# Patient Record
Sex: Female | Born: 1945 | Hispanic: No | Marital: Married | Smoking: Never smoker
Health system: Southern US, Community
[De-identification: ages and names within clinical notes are randomized; demographics above are authoritative.]

## PROBLEM LIST (undated history)

## (undated) DIAGNOSIS — E039 Hypothyroidism, unspecified: Secondary | ICD-10-CM

## (undated) DIAGNOSIS — M4800 Spinal stenosis, site unspecified: Secondary | ICD-10-CM

## (undated) DIAGNOSIS — E079 Disorder of thyroid, unspecified: Secondary | ICD-10-CM

## (undated) DIAGNOSIS — I10 Essential (primary) hypertension: Secondary | ICD-10-CM

## (undated) HISTORY — DX: Disorder of thyroid, unspecified: E07.9

## (undated) HISTORY — DX: Spinal stenosis, site unspecified: M48.00

---

## 2009-06-08 DEATH — deceased

## 2017-04-17 ENCOUNTER — Encounter: Payer: Self-pay | Admitting: Cardiovascular Disease

## 2019-09-21 ENCOUNTER — Other Ambulatory Visit: Payer: Self-pay | Admitting: Neurology

## 2019-09-21 ENCOUNTER — Encounter: Payer: Self-pay | Admitting: Cardiovascular Disease

## 2019-09-21 ENCOUNTER — Other Ambulatory Visit: Payer: Self-pay

## 2019-09-21 ENCOUNTER — Ambulatory Visit (INDEPENDENT_AMBULATORY_CARE_PROVIDER_SITE_OTHER): Payer: Medicare Other | Admitting: Cardiovascular Disease

## 2019-09-21 VITALS — BP 138/84 | HR 57 | Ht 63.0 in | Wt 120.5 lb

## 2019-09-21 DIAGNOSIS — M5412 Radiculopathy, cervical region: Secondary | ICD-10-CM

## 2019-09-21 DIAGNOSIS — I209 Angina pectoris, unspecified: Secondary | ICD-10-CM

## 2019-09-21 DIAGNOSIS — I10 Essential (primary) hypertension: Secondary | ICD-10-CM

## 2019-09-21 DIAGNOSIS — M79602 Pain in left arm: Secondary | ICD-10-CM

## 2019-09-21 DIAGNOSIS — R079 Chest pain, unspecified: Secondary | ICD-10-CM | POA: Diagnosis not present

## 2019-09-21 DIAGNOSIS — M5481 Occipital neuralgia: Secondary | ICD-10-CM

## 2019-09-21 MED ORDER — LOSARTAN POTASSIUM 50 MG PO TABS: 75.0000 mg | ORAL_TABLET | Freq: Every day | ORAL | 6 refills | Status: DC

## 2019-09-21 NOTE — Progress Notes (Signed)
Cardiology Office Note  Date:  09/21/2019   ID:  Katrina Donovan, DOB 1946/01/05, MRN [id number]  PCP:  Katrina Merles, MD   Chief Complaint  Patient presents with  . other    C/o Flunctuating BP and chest pain radiates left arm pain with neck pain and headaches. Meds reviewed verbally with pt.    HPI:  [name] is a 73 year old woman with history of hypertension Shortness of breath per outside records Who presents by self-referral for chest pain left arm pain  Recently seen by primary care physician, reported having some dizziness, neck pain, chest pain left arm left shoulder pain Reports blood pressure elevated [id number] systolic  She was referred to [facility] Underwent cardiac enzymes, additional lab work Cardiac enzymes negative, low BNP  EKG normal rhythm no significant ST-T wave changes Discharged with follow-up today  She has continued to have symptoms radiating into her left arm shoulder down in the biceps area Reports having family history of cardiac disease  Also having difficulty controlling her blood pressure Did not check it over the past year, started to check it recently Reports typically [id number] systolic or higher, sometimes [id number] systolic  Since she was told blood pressure was elevated she has not been eating very much, weight is down 10 pounds  EKG personally reviewed by myself on todays visit Shows sinus bradycardia rate 57 bpm no significant ST-T wave changes  Outside records reviewed, Myoview April 17, 2017 no ischemia Performed in [place]   PMH:   has a past medical history of Spinal stenosis and Thyroid disease.  PSH:   History reviewed. No pertinent surgical history.  Current Outpatient Medications  Medication Sig Dispense Refill  . aspirin EC 81 MG tablet Take 81 mg by mouth daily.    Marland Kitchen levothyroxine (SYNTHROID) 50 MCG tablet Take 50 mcg by mouth daily.    Marland Kitchen losartan (COZAAR) 50 MG tablet Take 1.5 tablets (75 mg total) by mouth daily. 45  tablet 6   No current facility-administered medications for this visit.     Allergies:   Patient has no allergy information on record.   Social History:  The patient  reports that she has never smoked. She has never used smokeless tobacco. She reports that she does not drink alcohol or use drugs.   Family History:   family history includes Cancer in her mother.    Review of Systems: Review of Systems  Constitutional: Negative.   HENT: Negative.   Respiratory: Negative.   Cardiovascular: Positive for chest pain.       Radiating to left arm, left neck  Gastrointestinal: Negative.   Musculoskeletal: Negative.   Neurological: Negative.   Psychiatric/Behavioral: Negative.   All other systems reviewed and are negative.   PHYSICAL EXAM: VS:  BP 138/84   Pulse (!) 57   Ht 5\' 3"  (1.6 m)   Wt 120 lb 8 oz (54.7 kg)   SpO2 99%   BMI 21.35 kg/m  , BMI Body mass index is 21.35 kg/m. GEN: Well nourished, well developed, in no acute distress HEENT: normal Neck: no JVD, carotid bruits, or masses Cardiac: RRR; no murmurs, rubs, or gallops,no edema  Respiratory:  clear to auscultation bilaterally, normal work of breathing GI: soft, nontender, nondistended, + BS MS: no deformity or atrophy Skin: warm and dry, no rash Neuro:  Strength and sensation are intact Psych: euthymic mood, full affect   Recent Labs: No results found for requested labs within last 8760 hours.  Lipid Panel No results found for: CHOL, HDL, LDLCALC, TRIG    Wt Readings from Last 3 Encounters:  09/21/19 120 lb 8 oz (54.7 kg)       ASSESSMENT AND PLAN:  Problem List Items Addressed This Visit    None    Visit Diagnoses    Chest pain of uncertain etiology    -  Primary   Relevant Orders   EKG 12-Lead   Pain of left upper extremity       Angina pectoris (HCC)       Relevant Medications   aspirin EC 81 MG tablet   losartan (COZAAR) 50 MG tablet     Etiology of her symptoms is unclear She is  non-smoker, no diabetes, cholesterol reasonable She is very concerned about underlying ischemia and requesting a stress test We have ordered pharmacologic Myoview for this week  Left shoulder pain Suspect component of musculoskeletal etiology Denies trauma, heavy lifting She is already taking Tylenol Suggested she continue icing, NSAIDs as needed Move blood pressure cuff over to right arm  Hypertension Reports blood pressure continues to run high at home Recommend she increase losartan from 50 up to 75 mg daily She has been very strict with her diet recently lost 10 pounds, recommend she could liberalize her diet Commend she call us in the next week or 2 with blood pressure measurements  Disposition:   F/U as needed   Total encounter time more than 45 minutes  Greater than 50% was spent in counseling and coordination of care with the patient    Signed, Esmond Plants, M.D., Ph.D. [place], [place]

## 2019-09-21 NOTE — Patient Instructions (Addendum)
We will order a stress test Thursday , lexiscan myoview For chest pain, CAD   Medication Instructions:  Please increase the losartan up to 75 mg daily  If you need a refill on your cardiac medications before your next appointment, please call your pharmacy.    Lab work: No new labs needed   If you have labs (blood work) drawn today and your tests are completely normal, you will receive your results only by: Marland Kitchen MyChart Message (if you have MyChart) OR . A paper copy in the mail If you have any lab test that is abnormal or we need to change your treatment, we will call you to review the results.   Testing/Procedures: [place]  Your caregiver has ordered a Stress Test with nuclear imaging. The purpose of this test is to evaluate the blood supply to your heart muscle. This procedure is referred to as a "Non-Invasive Stress Test." This is because other than having an IV started in your vein, nothing is inserted or "invades" your body. Cardiac stress tests are done to find areas of poor blood flow to the heart by determining the extent of coronary artery disease (CAD). Some patients exercise on a treadmill, which naturally increases the blood flow to your heart, while others who are  unable to walk on a treadmill due to physical limitations have a pharmacologic/chemical stress agent called Lexiscan . This medicine will mimic walking on a treadmill by temporarily increasing your coronary blood flow.   Please note: these test may take anywhere between 2-4 hours to complete  PLEASE REPORT TO [place] AT THE FIRST DESK WILL DIRECT YOU WHERE TO GO  Date of Procedure:_____________________________________  Arrival Time for Procedure:______________________________    PLEASE NOTIFY THE OFFICE AT LEAST 24 HOURS IN ADVANCE IF YOU ARE UNABLE TO KEEP YOUR APPOINTMENT.  [phone] AND  PLEASE NOTIFY NUCLEAR MEDICINE AT [facility] AT LEAST 24 HOURS IN ADVANCE IF YOU  ARE UNABLE TO KEEP YOUR APPOINTMENT. [phone]  How to prepare for your Myoview test:  1. Do not eat or drink after midnight 2. No caffeine for 24 hours prior to test 3. No smoking 24 hours prior to test. 4. Your medication may be taken with water.  If your doctor stopped a medication because of this test, do not take that medication. 5. Ladies, please do not wear dresses.  Skirts or pants are appropriate. Please wear a short sleeve shirt. 6. No perfume, cologne or lotion. 7. Wear comfortable walking shoes. No heels!    Follow-Up: At [facility], you and your health needs are our priority.  As part of our continuing mission to provide you with exceptional heart care, we have created designated Provider Care Teams.  These Care Teams include your primary Cardiologist (physician) and Advanced Practice Providers (APPs -  Physician Assistants and Nurse Practitioners) who all work together to provide you with the care you need, when you need it.  . You will need a follow up appointment as needed   . Providers on your designated Care Team:   . Murray Hodgkins, NP . Christell Faith, PA-C . Marrianne Mood, PA-C  Any Other Special Instructions Will Be Listed Below (If Applicable).  For educational health videos Log in to : [email or web address] Or : [email or web address], password : [email or web address]

## 2019-09-22 ENCOUNTER — Ambulatory Visit
Admission: RE | Admit: 2019-09-22 | Discharge: 2019-09-22 | Disposition: A | Discharge status: Home / Self Care | Payer: Medicare Other | Source: Ambulatory Visit | Attending: Neurology | Admitting: Neurology

## 2019-09-22 ENCOUNTER — Telehealth: Payer: Self-pay | Admitting: Cardiovascular Disease

## 2019-09-22 DIAGNOSIS — M5412 Radiculopathy, cervical region: Secondary | ICD-10-CM

## 2019-09-22 DIAGNOSIS — M5481 Occipital neuralgia: Secondary | ICD-10-CM | POA: Diagnosis present

## 2019-09-22 DIAGNOSIS — R079 Chest pain, unspecified: Secondary | ICD-10-CM

## 2019-09-22 NOTE — Telephone Encounter (Signed)
Order entered and ready for scheduling. Please make sure to schedule this Thursday 09/24/2019. I will then send time of testing to Dr. Mortimer Fries.

## 2019-09-22 NOTE — Telephone Encounter (Signed)
Please let scheduling know when nuc orders are in .

## 2019-09-24 ENCOUNTER — Other Ambulatory Visit: Payer: Self-pay

## 2019-09-24 ENCOUNTER — Ambulatory Visit
Admission: RE | Admit: 2019-09-24 | Discharge: 2019-09-24 | Disposition: A | Discharge status: Home / Self Care | Payer: Medicare Other | Source: Ambulatory Visit | Attending: Cardiovascular Disease | Admitting: Cardiovascular Disease

## 2019-09-24 DIAGNOSIS — R079 Chest pain, unspecified: Secondary | ICD-10-CM | POA: Diagnosis not present

## 2019-09-24 MED ORDER — TECHNETIUM TC 99M TETROFOSMIN IV KIT
10.7500 | PACK | Freq: Once | INTRAVENOUS | Status: AC | PRN
  Administered 2019-09-24: 10.75 via INTRAVENOUS

## 2019-09-24 MED ORDER — REGADENOSON 0.4 MG/5ML IV SOLN
0.4000 mg | Freq: Once | INTRAVENOUS | Status: AC
  Administered 2019-09-24: 0.4 mg via INTRAVENOUS
  Filled 2019-09-24: qty 5

## 2019-09-24 MED ORDER — TECHNETIUM TC 99M TETROFOSMIN IV KIT
29.6400 | PACK | Freq: Once | INTRAVENOUS | Status: AC | PRN
  Administered 2019-09-24: 29.64 via INTRAVENOUS

## 2019-09-25 LAB — NM MYOCAR MULTI W/SPECT W/WALL MOTION / EF
LV dias vol: 44 mL (ref 46–106)
LV sys vol: 11 mL
Peak HR: 99 {beats}/min
Percent HR: 67 %
Rest HR: 57 {beats}/min
SDS: 1
SRS: 6
SSS: 0
TID: 1.11

## 2019-10-17 ENCOUNTER — Ambulatory Visit: Payer: Medicare Other | Attending: Internal Medicine

## 2019-10-17 DIAGNOSIS — Z23 Encounter for immunization: Secondary | ICD-10-CM

## 2019-10-17 NOTE — Progress Notes (Signed)
   Covid-19 Vaccination Clinic  Name:  Katrina Donovan    MRN: [id number] DOB: [birth year]  10/17/2019  [name] was observed post Covid-19 immunization for 15 minutes without incidence. She was provided with Vaccine Information Sheet and instruction to access the V-Safe system.   [name] was instructed to call 911 with any severe reactions post vaccine: Marland Kitchen Difficulty breathing  . Swelling of your face and throat  . A fast heartbeat  . A bad rash all over your body  . Dizziness and weakness    Immunizations Administered    Name Date Dose VIS Date Route   Pfizer COVID-19 Vaccine 10/17/2019  2:55 PM 0.3 mL 09/18/2019 Intramuscular   Manufacturer: [place]   Lot: [id number]   [place]: [id number]

## 2019-11-04 ENCOUNTER — Ambulatory Visit: Payer: Medicare Other

## 2019-11-07 ENCOUNTER — Ambulatory Visit: Payer: Medicare Other | Attending: Internal Medicine

## 2019-11-07 DIAGNOSIS — Z23 Encounter for immunization: Secondary | ICD-10-CM

## 2019-11-07 NOTE — Progress Notes (Signed)
   Covid-19 Vaccination Clinic  Name:  Katrina Donovan    MRN: [id number] DOB: [birth year]  11/07/2019  [name] was observed post Covid-19 immunization for 15 minutes without incidence. She was provided with Vaccine Information Sheet and instruction to access the V-Safe system.   [name] was instructed to call 911 with any severe reactions post vaccine: Marland Kitchen Difficulty breathing  . Swelling of your face and throat  . A fast heartbeat  . A bad rash all over your body  . Dizziness and weakness    Immunizations Administered    Name Date Dose VIS Date Route   Pfizer COVID-19 Vaccine 11/07/2019 12:25 PM 0.3 mL 09/18/2019 Intramuscular   Manufacturer: [place]   Lot: [id number]   [place]: [id number]

## 2020-07-18 ENCOUNTER — Telehealth (INDEPENDENT_AMBULATORY_CARE_PROVIDER_SITE_OTHER): Payer: Medicare Other | Admitting: Gastroenterology

## 2020-07-18 DIAGNOSIS — R634 Abnormal weight loss: Secondary | ICD-10-CM

## 2020-07-18 DIAGNOSIS — R197 Diarrhea, unspecified: Secondary | ICD-10-CM

## 2020-07-18 NOTE — Progress Notes (Signed)
Katrina Donovan  [address]  [place]  [place], [place] [address]  Main: [phone]  Fax: [phone]   Gastroenterology Consultation  Referring Provider:     Charlsie Merles* Primary Care Physician:  Charlsie Merles, MD Reason for Consultation:     Diarrhea         HPI:   Virtual Visit via video  Note  I connected with patient on 07/18/20 at  9:15 AM EDT by video  and verified that I am speaking with the correct person using two identifiers.   I discussed the limitations, risks, security and privacy concerns of performing an evaluation and management service by video and the availability of in person appointments. I also discussed with the patient that there may be a patient responsible charge related to this service. The patient expressed understanding and agreed to proceed.  Location of the patient: Home Location of provider: Home Participating persons: Patient and provider only   History of Present Illness: Diarrhea  Katrina Donovan is a 74 y.o. y/o female referred for consultation & management  by Dr. Posey Pronto, Daymon Larsen, MD.  She states that for the past 6 weeks she has been being diarrhea multiple bowel movements per day that are nonbloody and she has lost about 5 pounds of weight.  Lots of gas and bloating.  Denies any use of any artificial sweeteners, soda, new medications.  Last colonoscopy was 3 to 5 years back and she recollects was normal.  No clear abdominal pain.  She is a bit concerned about the onset of these new symptoms.  She reported she had recent stool tests which were negative for any infection.  Past Medical History:  Diagnosis Date  . Spinal stenosis   . Thyroid disease     No past surgical history on file.  Prior to Admission medications   Medication Sig Start Date End Date Taking? Authorizing Provider  aspirin EC 81 MG tablet Take 81 mg by mouth daily.    [provider]  levothyroxine (SYNTHROID) 50 MCG tablet  Take 50 mcg by mouth daily. 09/01/19   [provider]  losartan (COZAAR) 50 MG tablet Take 1.5 tablets (75 mg total) by mouth daily. 09/21/19   Minna Merritts, MD    Family History  Problem Relation Age of Onset  . Cancer Mother      Social History   Tobacco Use  . Smoking status: Never Smoker  . Smokeless tobacco: Never Used  Substance Use Topics  . Alcohol use: Never  . Drug use: Never    Allergies as of 07/18/2020  . (Not on File)    Review of Systems:    All systems reviewed and negative except where noted in HPI. General Appearance:    Alert, cooperative, no distress, appears stated age  Head:    Normocephalic, without obvious abnormality, atraumatic  Eyes:    PERRL, conjunctiva/corneas clear,  Ears:    Grossly normal hearing    Neurologic:   Grossly appears normal     Observations/Objective:  Labs: CBC No results found for: WBC, RBC, HGB, HCT, PLT, MCV, MCH, MCHC, RDW, LYMPHSABS, MONOABS, EOSABS, BASOSABS CMP  No results found for: NA, K, CL, CO2, GLUCOSE, BUN, CREATININE, CALCIUM, PROT, ALBUMIN, AST, ALT, ALKPHOS, BILITOT, GFRNONAA, GFRAA  Imaging Studies: No results found.  Assessment and Plan:   [name] is a 74 y.o. y/o female has been referred for diarrhea ongoing for over 6 weeks.  She states that  her stool tests were negative.  Weight loss 5 pounds.    Plan :   1. Check TSH, celiac serology, fecal calprotectin 2. Obtain recent results of stool tests 3. Schedule for EGD and colonoscopy.  If negative will consider imaging down the road may end up treating with Xifaxan if all tests are negative.  Follow-up in 6 weeks  I have discussed alternative options, risks & benefits,  which include, but are not limited to, bleeding, infection, perforation,respiratory complication & drug reaction.  The patient agrees with this plan & written consent will be obtained.       I discussed the assessment and treatment plan with the patient.  The patient was provided an opportunity to ask questions and all were answered. The patient agreed with the plan and demonstrated an understanding of the instructions.   The patient was advised to call back or seek an in-person evaluation if the symptoms worsen or if the condition fails to improve as anticipated.  I provided 15 minutes of face-to-face time during this encounter.   Dr Katrina Bellows MD,MRCP [facility]) Gastroenterology/Hepatology Pager: [phone]   Speech recognition software was used to dictate the above note.

## 2020-07-19 ENCOUNTER — Other Ambulatory Visit: Payer: Self-pay

## 2020-07-19 ENCOUNTER — Telehealth: Payer: Self-pay

## 2020-07-19 DIAGNOSIS — R197 Diarrhea, unspecified: Secondary | ICD-10-CM

## 2020-07-19 DIAGNOSIS — R634 Abnormal weight loss: Secondary | ICD-10-CM

## 2020-07-19 MED ORDER — NA SULFATE-K SULFATE-MG SULF 17.5-3.13-1.6 GM/177ML PO SOLN: 1.0000 | Freq: Once | ORAL | 0 refills | Status: AC

## 2020-07-19 NOTE — Telephone Encounter (Signed)
Called pt to schedule endoscopy procedures and also give information regarding her pending labs.  Unable to contact, lvm to return call

## 2020-07-22 ENCOUNTER — Other Ambulatory Visit
Admission: RE | Admit: 2020-07-22 | Discharge: 2020-07-22 | Disposition: A | Discharge status: Home / Self Care | Payer: Medicare Other | Source: Ambulatory Visit | Attending: Gastroenterology | Admitting: Gastroenterology

## 2020-07-22 ENCOUNTER — Other Ambulatory Visit: Payer: Self-pay

## 2020-07-22 DIAGNOSIS — Z20822 Contact with and (suspected) exposure to covid-19: Secondary | ICD-10-CM | POA: Insufficient documentation

## 2020-07-22 DIAGNOSIS — Z01812 Encounter for preprocedural laboratory examination: Secondary | ICD-10-CM | POA: Insufficient documentation

## 2020-07-22 LAB — SARS CORONAVIRUS 2 (TAT 6-24 HRS): SARS Coronavirus 2: NEGATIVE

## 2020-07-25 ENCOUNTER — Encounter: Payer: Self-pay | Admitting: Internal Medicine

## 2020-07-25 LAB — MISC LABCORP TEST (SEND OUT): Labcorp test code: 165118

## 2020-07-26 ENCOUNTER — Ambulatory Visit
Admission: RE | Admit: 2020-07-26 | Discharge: 2020-07-26 | Disposition: A | Discharge status: Home / Self Care | Payer: Medicare Other | Attending: Gastroenterology | Admitting: Gastroenterology

## 2020-07-26 ENCOUNTER — Ambulatory Visit: Payer: Medicare Other | Admitting: Certified Registered"

## 2020-07-26 ENCOUNTER — Encounter
Admission: RE | Disposition: A | Discharge status: Home / Self Care | Payer: Self-pay | Source: Home / Self Care | Attending: Gastroenterology

## 2020-07-26 ENCOUNTER — Encounter: Payer: Self-pay | Admitting: Gastroenterology

## 2020-07-26 DIAGNOSIS — Z79890 Hormone replacement therapy (postmenopausal): Secondary | ICD-10-CM | POA: Diagnosis not present

## 2020-07-26 DIAGNOSIS — I10 Essential (primary) hypertension: Secondary | ICD-10-CM | POA: Diagnosis not present

## 2020-07-26 DIAGNOSIS — D122 Benign neoplasm of ascending colon: Secondary | ICD-10-CM | POA: Insufficient documentation

## 2020-07-26 DIAGNOSIS — Z79899 Other long term (current) drug therapy: Secondary | ICD-10-CM | POA: Diagnosis not present

## 2020-07-26 DIAGNOSIS — R634 Abnormal weight loss: Secondary | ICD-10-CM | POA: Insufficient documentation

## 2020-07-26 DIAGNOSIS — Z7982 Long term (current) use of aspirin: Secondary | ICD-10-CM | POA: Diagnosis not present

## 2020-07-26 DIAGNOSIS — K2950 Unspecified chronic gastritis without bleeding: Secondary | ICD-10-CM | POA: Diagnosis not present

## 2020-07-26 DIAGNOSIS — E039 Hypothyroidism, unspecified: Secondary | ICD-10-CM | POA: Insufficient documentation

## 2020-07-26 DIAGNOSIS — R197 Diarrhea, unspecified: Secondary | ICD-10-CM | POA: Diagnosis not present

## 2020-07-26 HISTORY — PX: ESOPHAGOGASTRODUODENOSCOPY (EGD) WITH PROPOFOL: SHX5813

## 2020-07-26 HISTORY — DX: Essential (primary) hypertension: I10

## 2020-07-26 HISTORY — DX: Hypothyroidism, unspecified: E03.9

## 2020-07-26 HISTORY — PX: COLONOSCOPY WITH PROPOFOL: SHX5780

## 2020-07-26 SURGERY — COLONOSCOPY WITH PROPOFOL
Anesthesia: General

## 2020-07-26 MED ORDER — PHENYLEPHRINE HCL (PRESSORS) 10 MG/ML IV SOLN
INTRAVENOUS | Status: DC | PRN
  Administered 2020-07-26: 100 ug via INTRAVENOUS

## 2020-07-26 MED ORDER — PROPOFOL 10 MG/ML IV BOLUS
INTRAVENOUS | Status: DC | PRN
  Administered 2020-07-26: 50 mg via INTRAVENOUS

## 2020-07-26 MED ORDER — GLYCOPYRROLATE 0.2 MG/ML IJ SOLN
INTRAMUSCULAR | Status: DC | PRN
  Administered 2020-07-26: .2 mg via INTRAVENOUS

## 2020-07-26 MED ORDER — LIDOCAINE HCL (CARDIAC) PF 100 MG/5ML IV SOSY
PREFILLED_SYRINGE | INTRAVENOUS | Status: DC | PRN
  Administered 2020-07-26: 100 mg via INTRAVENOUS

## 2020-07-26 MED ORDER — EPHEDRINE SULFATE 50 MG/ML IJ SOLN
INTRAMUSCULAR | Status: DC | PRN
  Administered 2020-07-26 (×2): 5 mg via INTRAVENOUS

## 2020-07-26 MED ORDER — SODIUM CHLORIDE 0.9 % IV SOLN: INTRAVENOUS | Status: DC

## 2020-07-26 MED ORDER — PROPOFOL 500 MG/50ML IV EMUL
INTRAVENOUS | Status: DC | PRN
  Administered 2020-07-26: 145 ug/kg/min via INTRAVENOUS

## 2020-07-26 NOTE — H&P (Addendum)
[name], MD [address] [place], [place], [place], [address] [id number], [place], [place], [place], [address] Phone: [phone]  Fax: [phone]  Primary Care Physician:  Charlsie Merles, MD   Pre-Procedure History & Physical: HPI:  Katrina Donovan is a 74 y.o. female is here for an endoscopy and colonoscopy    Past Medical History:  Diagnosis Date  . Hypertension   . Hypothyroidism   . Spinal stenosis   . Thyroid disease     History reviewed. No pertinent surgical history.  Prior to Admission medications   Medication Sig Start Date End Date Taking? Authorizing Provider  levothyroxine (SYNTHROID) 50 MCG tablet Take 50 mcg by mouth daily. 09/01/19  Yes [provider]  losartan (COZAAR) 50 MG tablet Take 1.5 tablets (75 mg total) by mouth daily. 09/21/19  Yes Minna Merritts, MD  aspirin EC 81 MG tablet Take 81 mg by mouth daily.    [provider]    Allergies as of 07/19/2020  . (Not on File)    Family History  Problem Relation Age of Onset  . Cancer Mother     Social History   Socioeconomic History  . Marital status: Married    Spouse name: Not on file  . Number of children: Not on file  . Years of education: Not on file  . Highest education level: Not on file  Occupational History  . Not on file  Tobacco Use  . Smoking status: Never Smoker  . Smokeless tobacco: Never Used  Vaping Use  . Vaping Use: Never used  Substance and Sexual Activity  . Alcohol use: Never  . Drug use: Never  . Sexual activity: Not on file  Other Topics Concern  . Not on file  Social History Narrative  . Not on file   Social Determinants of Health   Financial Resource Strain:   . Difficulty of Paying Living Expenses: Not on file  Food Insecurity:   . Worried About Charity fundraiser in the Last Year: Not on file  . Ran Out of Food in the Last Year: Not on file  Transportation Needs:   . Lack of Transportation (Medical): Not  on file  . Lack of Transportation (Non-Medical): Not on file  Physical Activity:   . Days of Exercise per Week: Not on file  . Minutes of Exercise per Session: Not on file  Stress:   . Feeling of Stress : Not on file  Social Connections:   . Frequency of Communication with Friends and Family: Not on file  . Frequency of Social Gatherings with Friends and Family: Not on file  . Attends Religious Services: Not on file  . Active Member of Clubs or Organizations: Not on file  . Attends Archivist Meetings: Not on file  . Marital Status: Not on file  Intimate Partner Violence:   . Fear of Current or Ex-Partner: Not on file  . Emotionally Abused: Not on file  . Physically Abused: Not on file  . Sexually Abused: Not on file    Review of Systems: See HPI, otherwise negative ROS  Physical Exam: There were no vitals taken for this visit. General:   Alert,  pleasant and cooperative in NAD Head:  Normocephalic and atraumatic. Neck:  Supple; no masses or thyromegaly. Lungs:  Clear throughout to auscultation, normal respiratory effort.    Heart:  +S1, +S2, Regular rate and rhythm, No edema. Abdomen:  Soft, nontender and  nondistended. Normal bowel sounds, without guarding, and without rebound.   Neurologic:  Alert and  oriented x4;  grossly normal neurologically.  Impression/Plan: Katrina Donovan is here for an endoscopy and colonoscopy  to be performed for  evaluation of unintentional weight loss and diarrhea    Risks, benefits, limitations, and alternatives regarding endoscopy have been reviewed with the patient.  Questions have been answered.  All parties agreeable.   [name], MD  07/26/2020, 9:28 AM

## 2020-07-26 NOTE — Anesthesia Procedure Notes (Signed)
Procedure Name: General with mask airway Performed by: Kelton Pillar, CRNA Pre-anesthesia Checklist: Patient identified, Suction available, Emergency Drugs available and Patient being monitored Patient Re-evaluated:Patient Re-evaluated prior to induction Oxygen Delivery Method: Simple face mask Induction Type: IV induction Placement Confirmation: positive ETCO2 and CO2 detector Dental Injury: Teeth and Oropharynx as per pre-operative assessment

## 2020-07-26 NOTE — Op Note (Signed)
[facility]enter[facility] Patient Name: Katrina Donovan Procedure Date: 07/26/2020 9:35 AM MRN: [id number] Account #: [id number] Date of Birth: 1946/06/09 Admit Type: Outpatient Age: 74 Room: [facility] ENDO ROOM 1 Gender: Female Note Status: Finalized Procedure:             Colonoscopy Indications:           Clinically significant diarrhea of unexplained origin Providers:             [name] MD, MD Medicines:             Monitored Anesthesia Care Complications:         No immediate complications. Procedure:             Pre-Anesthesia Assessment:                        - Prior to the procedure, a History and Physical was                         performed, and patient medications, allergies and                         sensitivities were reviewed. The patient's tolerance                         of previous anesthesia was reviewed.                        - The risks and benefits of the procedure and the                         sedation options and risks were discussed with the                         patient. All questions were answered and informed                         consent was obtained.                        - ASA Grade Assessment: II - A patient with mild                         systemic disease.                        After obtaining informed consent, the colonoscope was                         passed under direct vision. Throughout the procedure,                         the patient's blood pressure, pulse, and oxygen                         saturations were monitored continuously. The                         Colonoscope was introduced through the anus and  advanced to the the terminal ileum. The colonoscopy                         was performed with ease. The patient tolerated the                         procedure well. The quality of the bowel preparation                         was excellent. Findings:      The perianal and  digital rectal examinations were normal.      A 5 mm polyp was found in the ascending colon. The polyp was sessile.       The polyp was removed with a cold snare. Resection and retrieval were       complete.      The colon (entire examined portion) appeared normal. Biopsies for       histology were taken with a cold forceps from the entire colon for       evaluation of microscopic colitis.      The exam was otherwise without abnormality on direct and retroflexion       views. Impression:            - One 5 mm polyp in the ascending colon, removed with                         a cold snare. Resected and retrieved.                        - The entire examined colon is normal. Biopsied.                        - The examination was otherwise normal on direct and                         retroflexion views. Recommendation:        - Discharge patient to home (with escort).                        - Resume previous diet.                        - Continue present medications.                        - Await pathology results.                        - Return to my office in 3 weeks. Procedure Code(s):     --- Professional ---                        [phone], Colonoscopy, flexible; with removal of                         tumor(s), polyp(s), or other lesion(s) by snare                         technique  90240, 46, Colonoscopy, flexible; with biopsy, single                         or multiple Diagnosis Code(s):     --- Professional ---                        K63.5, Polyp of colon                        R19.7, Diarrhea, unspecified CPT copyright 2019 American Medical Association. All rights reserved. The codes documented in this report are preliminary and upon coder review may  be revised to meet current compliance requirements. [name], MD [name] MD, MD 07/26/2020 10:35:34 AM This report has been signed electronically. Number of Addenda: 0 Note Initiated On: 07/26/2020  9:35 AM Scope Withdrawal Time: 0 hours 15 minutes 43 seconds  Total Procedure Duration: 0 hours 20 minutes 36 seconds  Estimated Blood Loss:  Estimated blood loss: none.      [facility]

## 2020-07-26 NOTE — Anesthesia Preprocedure Evaluation (Signed)
Anesthesia Evaluation  Patient identified by MRN, date of birth, ID band Patient awake    Reviewed: Allergy & Precautions, H&P , NPO status , Patient's Chart, lab work & pertinent test results  History of Anesthesia Complications Negative for: history of anesthetic complications  Airway Mallampati: II  TM Distance: >3 FB     Dental  (+) Teeth Intact   Pulmonary neg pulmonary ROS, neg sleep apnea, neg COPD,    breath sounds clear to auscultation       Cardiovascular hypertension, (-) angina(-) Past MI and (-) Cardiac Stents (-) dysrhythmias  Rhythm:regular Rate:Normal     Neuro/Psych negative neurological ROS  negative psych ROS   GI/Hepatic negative GI ROS, Neg liver ROS,   Endo/Other  Hypothyroidism   Renal/GU negative Renal ROS  negative genitourinary   Musculoskeletal   Abdominal   Peds  Hematology negative hematology ROS (+)   Anesthesia Other Findings Past Medical History: No date: Hypertension No date: Hypothyroidism No date: Spinal stenosis No date: Thyroid disease  History reviewed. No pertinent surgical history.     Reproductive/Obstetrics negative OB ROS                             Anesthesia Physical Anesthesia Plan  ASA: II  Anesthesia Plan: General   Post-op Pain Management:    Induction:   PONV Risk Score and Plan: Propofol infusion and TIVA  Airway Management Planned: Nasal Cannula  Additional Equipment:   Intra-op Plan:   Post-operative Plan:   Informed Consent: I have reviewed the patients History and Physical, chart, labs and discussed the procedure including the risks, benefits and alternatives for the proposed anesthesia with the patient or authorized representative who has indicated his/her understanding and acceptance.     Dental Advisory Given  Plan Discussed with: Anesthesiologist, CRNA and Surgeon  Anesthesia Plan Comments:          Anesthesia Quick Evaluation

## 2020-07-26 NOTE — Transfer of Care (Signed)
Immediate Anesthesia Transfer of Care Note  Patient: Katrina Donovan  Procedure(s) Performed: COLONOSCOPY WITH PROPOFOL (N/A ) ESOPHAGOGASTRODUODENOSCOPY (EGD) WITH PROPOFOL (N/A )  Patient Location: Endoscopy Unit  Anesthesia Type:General  Level of Consciousness: drowsy and responds to stimulation  Airway & Oxygen Therapy: Patient Spontanous Breathing and Patient connected to face mask oxygen  Post-op Assessment: Report given to RN and Post -op Vital signs reviewed and stable  Post vital signs: Reviewed and stable  Last Vitals:  Vitals Value Taken Time  BP 111/61 07/26/20 1034  Temp 36.5 C 07/26/20 1034  Pulse 82 07/26/20 1035  Resp 19 07/26/20 1035  SpO2 100 % 07/26/20 1035  Vitals shown include unvalidated device data.  Last Pain: There were no vitals filed for this visit.       Complications: No complications documented.

## 2020-07-26 NOTE — Op Note (Addendum)
[facility] Gastroenterology Patient Name: Katrina Donovan Procedure Date: 07/26/2020 9:35 AM MRN: [id number] Account #: [id number] Date of Birth: Aug 15, 1946 Admit Type: Outpatient Age: 74 Room: [facility] ENDO ROOM 1 Gender: Female Note Status: Finalized Procedure:             Upper GI endoscopy Indications:           Weight loss Providers:             [name] MD, MD Medicines:             Monitored Anesthesia Care Complications:         No immediate complications. Procedure:             Pre-Anesthesia Assessment:                        - Prior to the procedure, a History and Physical was                         performed, and patient medications, allergies and                         sensitivities were reviewed. The patient's tolerance                         of previous anesthesia was reviewed.                        - The risks and benefits of the procedure and the                         sedation options and risks were discussed with the                         patient. All questions were answered and informed                         consent was obtained.                        - ASA Grade Assessment: II - A patient with mild                         systemic disease.                        After obtaining informed consent, the endoscope was                         passed under direct vision. Throughout the procedure,                         the patient's blood pressure, pulse, and oxygen                         saturations were monitored continuously. The Endoscope                         was introduced through the mouth, and advanced to the  third part of duodenum. The upper GI endoscopy was                         accomplished with ease. The patient tolerated the                         procedure well. Findings:      The esophagus was normal.      The examined duodenum was normal.      Localized mild inflammation characterized by  congestion (edema) and       erythema was found on the lesser curvature of the gastric antrum.       Biopsies were taken with a cold forceps for histology.      The cardia and gastric fundus were normal on retroflexion. Impression:            - Normal esophagus.                        - Normal examined duodenum.                        - Gastritis. Biopsied. Recommendation:        - Await pathology results.                        - Perform a colonoscopy today. Procedure Code(s):     --- Professional ---                        [phone], Esophagogastroduodenoscopy, flexible,                         transoral; with biopsy, single or multiple CPT copyright 2019 American Medical Association. All rights reserved. The codes documented in this report are preliminary and upon coder review may  be revised to meet current compliance requirements. [name], MD [name] MD, MD 07/26/2020 10:08:19 AM This report has been signed electronically. Number of Addenda: 0 Note Initiated On: 07/26/2020 9:35 AM Estimated Blood Loss:  Estimated blood loss: none.      [facility]

## 2020-07-27 ENCOUNTER — Encounter: Payer: Self-pay | Admitting: Gastroenterology

## 2020-07-27 LAB — SURGICAL PATHOLOGY

## 2020-07-27 NOTE — Progress Notes (Signed)
Inform patient she has gastritis commence on Prilosec 40 mg once a day.  1 polyp was a tubular adenoma.  Rest of the biopsies were normal.  Office visit in 2 to 3 weeks to discuss treatments for irritable bowel syndrome.  Avoid NSAIDs.  No repeat colonoscopy in view of age.

## 2020-07-27 NOTE — Anesthesia Postprocedure Evaluation (Signed)
Anesthesia Post Note  Patient: Katrina Donovan  Procedure(s) Performed: COLONOSCOPY WITH PROPOFOL (N/A ) ESOPHAGOGASTRODUODENOSCOPY (EGD) WITH PROPOFOL (N/A )  Patient location during evaluation: PACU Anesthesia Type: General Level of consciousness: awake and alert Pain management: pain level controlled Vital Signs Assessment: post-procedure vital signs reviewed and stable Respiratory status: spontaneous breathing, nonlabored ventilation and respiratory function stable Cardiovascular status: blood pressure returned to baseline and stable Postop Assessment: no apparent nausea or vomiting Anesthetic complications: no   No complications documented.   Last Vitals:  Vitals:   07/26/20 1044 07/26/20 1054  BP: 120/77 135/75  Pulse: 75 63  Resp: (!) 24 (!) 21  Temp:    SpO2: 100% 100%    Last Pain:  Vitals:   07/26/20 1054  TempSrc:   PainSc: 0-No pain                 Brett Canales Dhaval Woo

## 2020-08-15 ENCOUNTER — Telehealth: Payer: Self-pay

## 2020-08-15 NOTE — Telephone Encounter (Signed)
Called pt to inform of results and Dr. Anna's recommendations.  Unable to contact, LVM to return call 

## 2020-08-15 NOTE — Telephone Encounter (Signed)
-----   Message from [name], MD sent at 07/27/2020  9:58 AM EDT ----- Inform patient she has gastritis commence on Prilosec 40 mg once a day.  1 polyp was a tubular adenoma.  Rest of the biopsies were normal.  Office visit in 2 to 3 weeks to discuss treatments for irritable bowel syndrome.  Avoid NSAIDs.  No repeat col onoscopy in view of age.

## 2020-08-15 NOTE — Telephone Encounter (Signed)
Pt has been notified of results and Dr. Georgeann Oppenheim recommendations. Pt's daughter will call back to schedule pt's follow up and inform us of what pharmacy she prefers.

## 2020-09-19 IMAGING — MR MR HEAD W/O CM
12 series · 48 of 48 positions shown · non-contrast
Comparison: None.

CLINICAL DATA: Dizziness and neck pain

EXAM:
MRI HEAD WITHOUT CONTRAST
TECHNIQUE: Multiplanar, multiecho pulse sequences of the brain and surrounding
structures were obtained without intravenous contrast.

[Series 5: ax dwi_tracew · axial · 3.0mm · 0.60mm/px · z∈[-135,+20]mm · 3 of 48 slices shown]
[im 1/48]
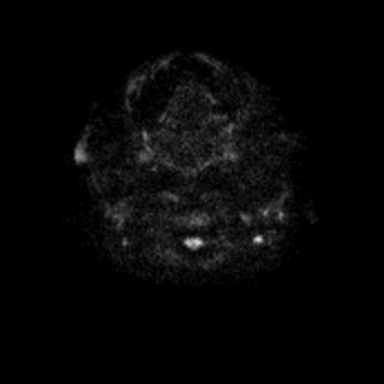
[im 24/48]
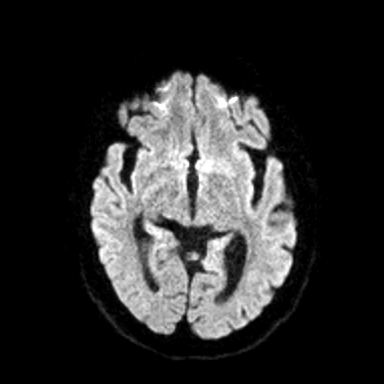
[im 48/48]
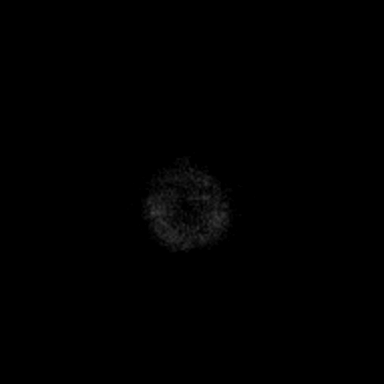

[Series 6: ax dwi_adc · axial · 3.0mm · 0.60mm/px · z∈[-135,+20]mm · 4 of 48 slices shown]
[im 1/48]
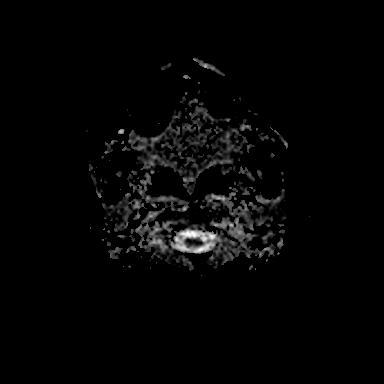
[im 16/48]
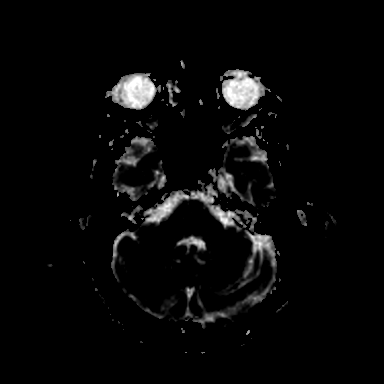
[im 32/48]
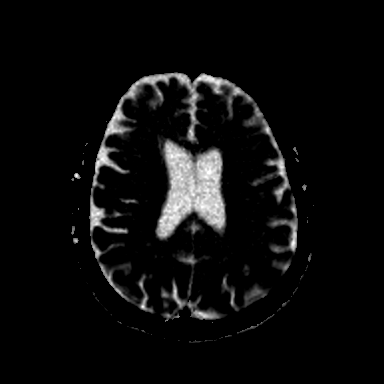
[im 48/48]
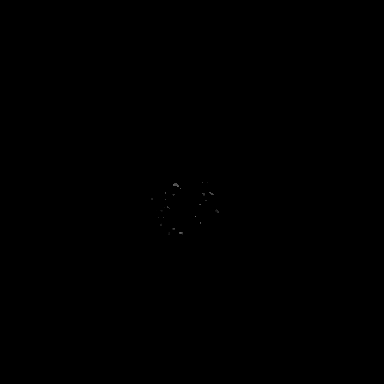

[Series 7: cor dwi_tracew · coronal · 5.0mm · 0.60mm/px · 3 of 38 slices shown]
[im 1/38]
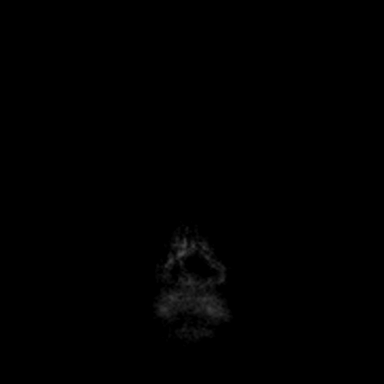
[im 19/38]
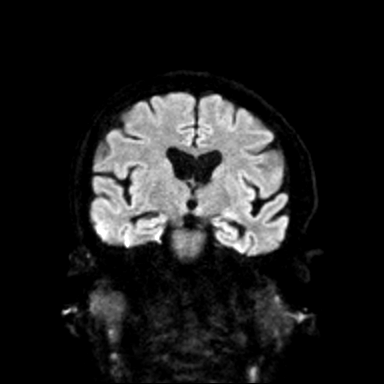
[im 38/38]
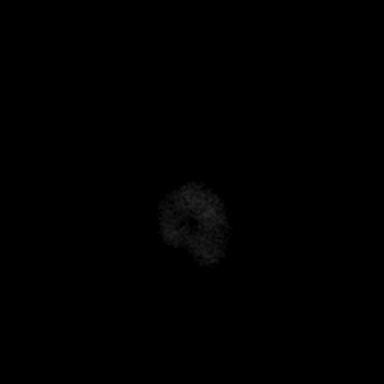

[Series 8: cor dwi_adc · coronal · 5.0mm · 0.60mm/px · 3 of 36 slices shown]
[im 1/36]
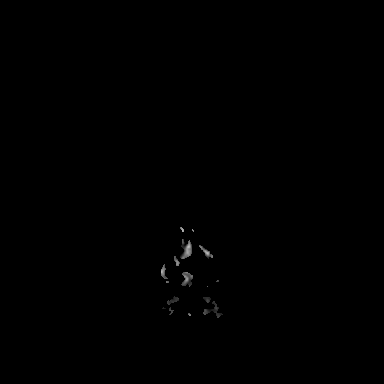
[im 18/36]
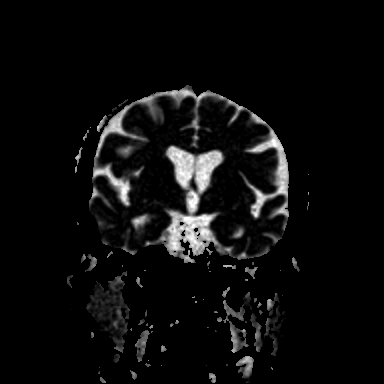
[im 36/36]
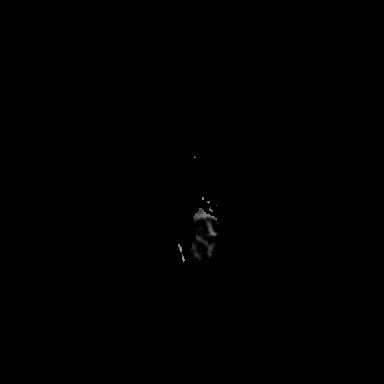

[Series 9: T1 · sagittal · 5.0mm · 0.62mm/px · 2 of 24 slices shown (1 of 2)]
[im 1/24]
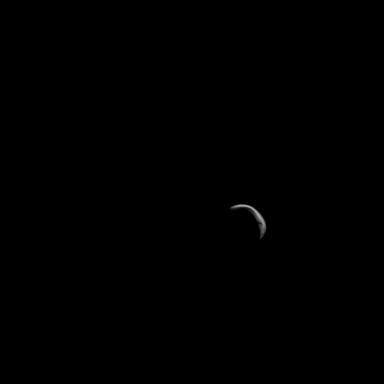
[im 24/24]
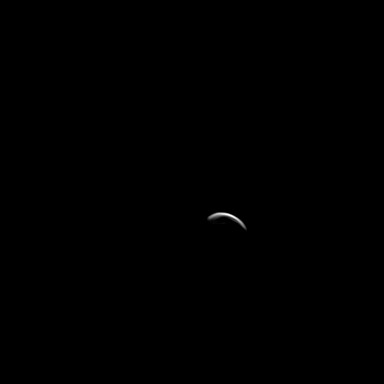

[Series 10: T2 · axial · 5.0mm · 0.53mm/px · z∈[-126,+17]mm · 2 of 25 slices shown]
[im 1/25]
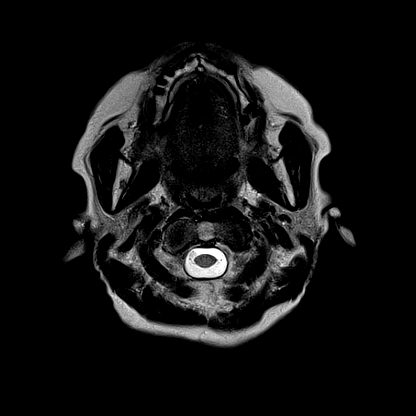
[im 25/25]
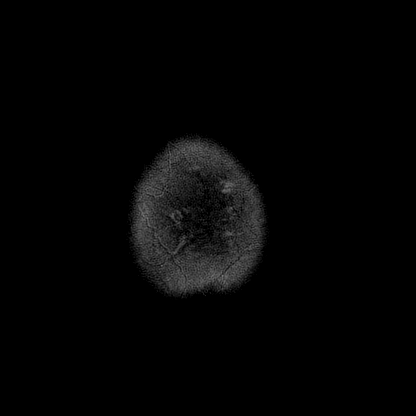

[Series 11: mag_images · axial · 3.0mm · 0.90mm/px · z∈[-143,+33]mm · 4 of 60 slices shown]
[im 1/60]
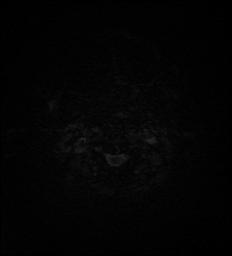
[im 20/60]
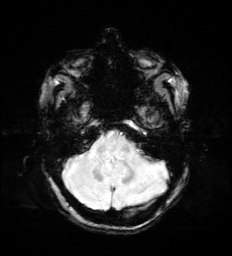
[im 40/60]
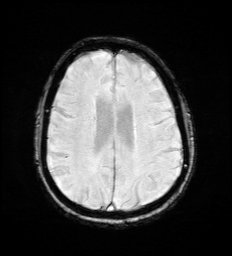
[im 60/60]
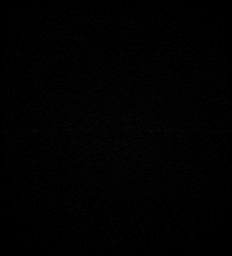

[Series 12: pha_images · axial · 3.0mm · 0.90mm/px · z∈[-143,+30]mm · 4 of 59 slices shown]
[im 1/59]
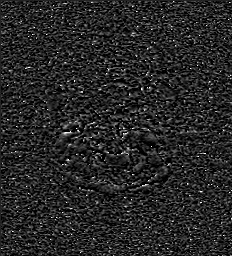
[im 20/59]
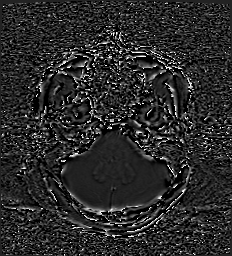
[im 39/59]
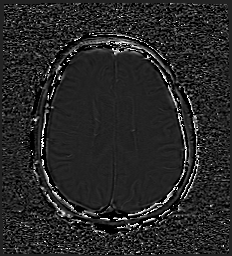
[im 59/59]
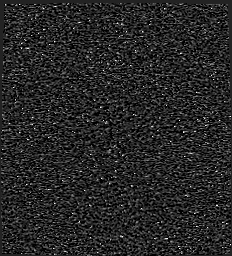

[Series 13: swi_images · axial · 3.0mm · 0.90mm/px · z∈[-143,+33]mm · 4 of 60 slices shown]
[im 1/60]
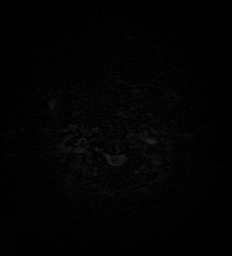
[im 20/60]
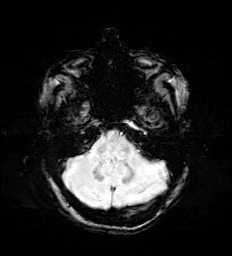
[im 40/60]
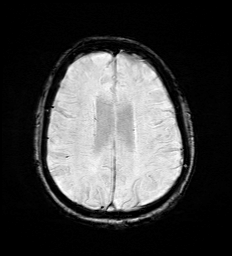
[im 60/60]
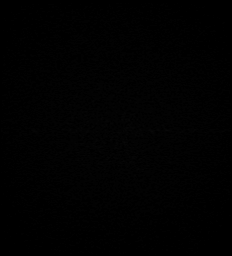

[Series 15: FLAIR · axial · 3.0mm · 0.53mm/px · z∈[-135,+26]mm · 4 of 55 slices shown]
[im 1/55]
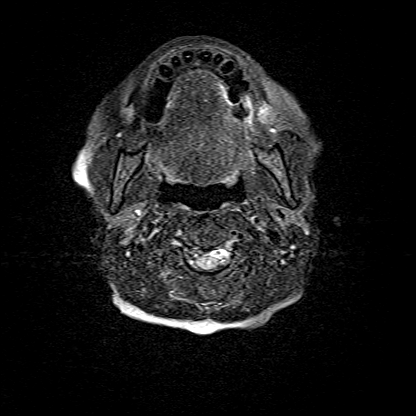
[im 19/55]
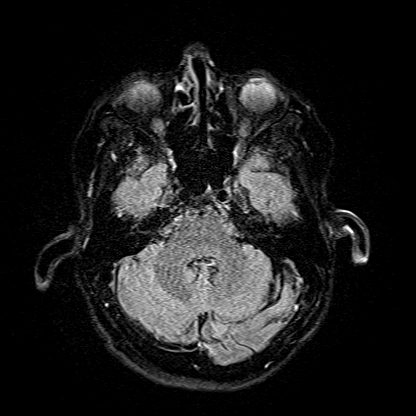
[im 37/55]
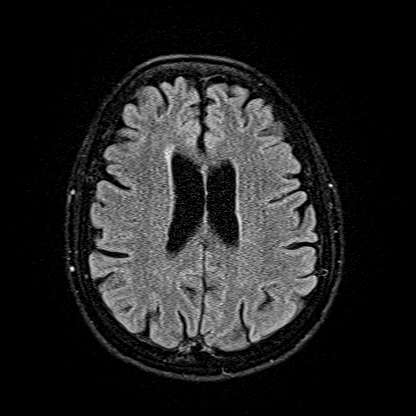
[im 55/55]
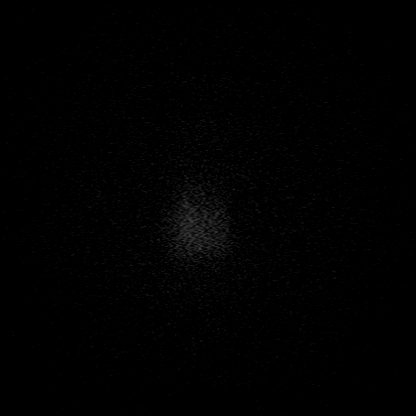

[Series 16: T1 · axial · 1.0mm · 0.98mm/px · z∈[-143,+31]mm · 13 of 176 slices shown (2 of 2)]
[im 1/176]
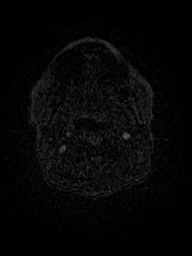
[im 15/176]
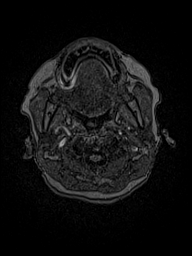
[im 30/176]
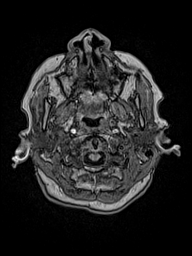
[im 44/176]
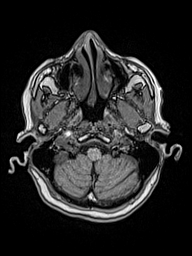
[im 59/176]
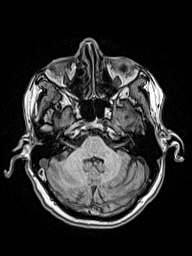
[im 73/176]
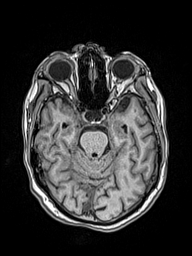
[im 88/176]
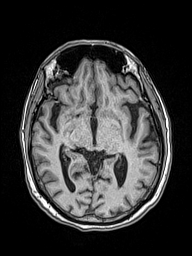
[im 103/176]
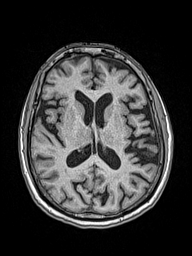
[im 117/176]
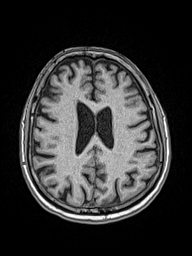
[im 132/176]
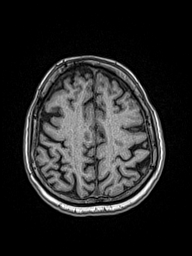
[im 146/176]
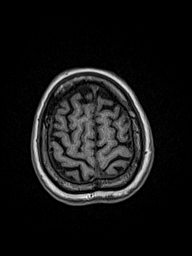
[im 161/176]
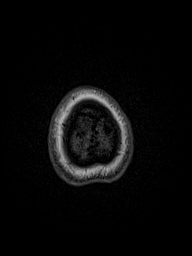
[im 176/176]
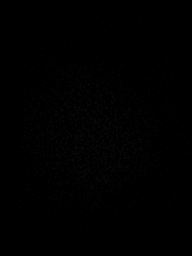

[Series 17: T2 post-contrast · coronal · 5.0mm · 0.57mm/px · 2 of 29 slices shown]
[im 1/29]
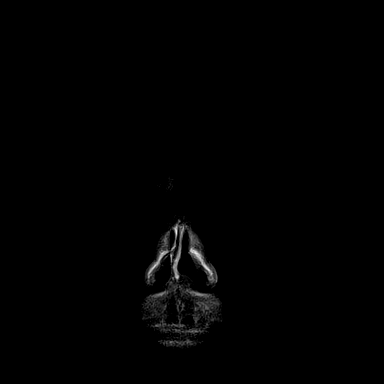
[im 29/29]
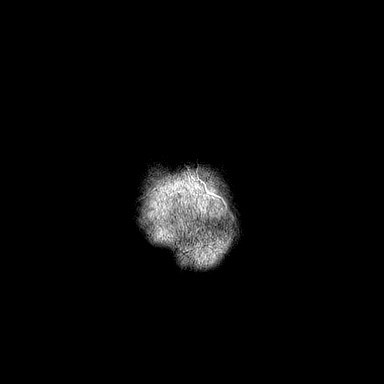

[48 of 48 positions shown; findings below may reference images not displayed]

FINDINGS: Brain: There is no acute infarction or intracranial hemorrhage.
There is no intracranial mass, mass effect, or edema. There is no
hydrocephalus or extra-axial fluid collection. Few small foci of T2
hyperintensity in the supratentorial white matter are nonspecific
but may reflect minor chronic microvascular ischemic changes.

Vascular: Major vessel flow voids at the skull base are preserved.

Skull and upper cervical spine: Normal marrow signal is preserved.

Sinuses/Orbits: Minor paranasal sinus mucosal thickening. Right lens
replacement.

Other: Incidental note is made of a partially empty sella. Mastoid
air cells are clear.
IMPRESSION: No intracranial mass, evidence of recent infarction, or hemorrhage.
Minor chronic microvascular ischemic changes.

## 2021-06-28 ENCOUNTER — Ambulatory Visit (INDEPENDENT_AMBULATORY_CARE_PROVIDER_SITE_OTHER): Payer: Medicare Other | Admitting: Internal Medicine

## 2021-06-28 ENCOUNTER — Encounter: Payer: Self-pay | Admitting: Internal Medicine

## 2021-06-28 ENCOUNTER — Other Ambulatory Visit: Payer: Self-pay

## 2021-06-28 DIAGNOSIS — G4719 Other hypersomnia: Secondary | ICD-10-CM | POA: Diagnosis not present

## 2021-06-28 NOTE — Progress Notes (Signed)
Name: Katrina Donovan MRN: [id number] DOB: 1945-11-07     I connected with the patient by telephone enabled telemedicine visit and verified that I am speaking with the correct person using two identifiers.    I discussed the limitations, risks, security and privacy concerns of performing an evaluation and management service by telemedicine and the availability of in-person appointments. I also discussed with the patient that there may be a patient responsible charge related to this service. The patient expressed understanding and agreed to proceed.  PATIENT AGREES AND CONFIRMS -YES   Other persons participating in the visit and their role in the encounter: Patient, nursing  This visit type was conducted due to national recommendations for restrictions regarding the COVID-19 Pandemic (e.g. social distancing).  This format is felt to be most appropriate for this patient at this time.  All issues noted in this document were discussed and addressed.     PATIENT AT HOME PHYSICIAN AT OFFICE  HISTORY OF PRESENT ILLNESS:   Patient is seen today for problems and issues with sleep related to excessive daytime sleepiness Patient  has been having sleep problems for many years Patient has been having excessive daytime sleepiness for a long time Patient has been having extreme fatigue and tiredness, lack of energy +  very Loud snoring every night    Discussed sleep data and reviewed with patient.  Encouraged proper weight management.  Discussed driving precautions and its relationship with hypersomnolence.  Discussed operating dangerous equipment and its relationship with hypersomnolence.  Discussed sleep hygiene, and benefits of a fixed sleep waked time.  The importance of getting eight or more hours of sleep discussed with patient.  Discussed limiting the use of the computer and television before bedtime.  Decrease naps during the day, so night time sleep will become enhanced.  Limit  caffeine, and sleep deprivation.  HTN, stroke, and heart failure are potential risk factors.    EPWORTH SLEEP SCORE 10    PAST MEDICAL HISTORY :   has a past medical history of Hypertension, Hypothyroidism, Spinal stenosis, and Thyroid disease.  has a past surgical history that includes Colonoscopy with propofol (N/A, 07/26/2020) and Esophagogastroduodenoscopy (egd) with propofol (N/A, 07/26/2020). Prior to Admission medications   Medication Sig Start Date End Date Taking? Authorizing Provider  aspirin EC 81 MG tablet Take 81 mg by mouth daily.    [provider]  levothyroxine (SYNTHROID) 50 MCG tablet Take 50 mcg by mouth daily. 09/01/19   [provider]  losartan (COZAAR) 50 MG tablet Take 1.5 tablets (75 mg total) by mouth daily. 09/21/19   Minna Merritts, MD   Not on File  FAMILY HISTORY:  family history includes Cancer in her mother. SOCIAL HISTORY:  reports that she has never smoked. She has never used smokeless tobacco. She reports that she does not drink alcohol and does not use drugs.   Review of Systems:  Gen:  Denies  fever, sweats, chills weight loss excessive daytime sleepiness HEENT: Denies blurred vision, double vision, ear pain, eye pain, hearing loss, nose bleeds, sore throat Cardiac:  No dizziness, chest pain or heaviness, chest tightness,edema, No JVD Resp:   No cough, -sputum production, -shortness of breath,-wheezing, -hemoptysis,  Gi: Denies swallowing difficulty, stomach pain, nausea or vomiting, diarrhea, constipation, bowel incontinence Gu:  Denies bladder incontinence, burning urine Ext:   Denies Joint pain, stiffness or swelling Skin: Denies  skin rash, easy bruising or bleeding or hives Endoc:  Denies polyuria, polydipsia , polyphagia or  weight change Psych:   Denies depression, +insomnia or hallucinations  Other:  All other systems negative   ALL OTHER ROS ARE NEGATIVE       ASSESSMENT AND PLAN SYNOPSIS  75 yo  [place] female with excessive daytime sleepiness, patient was seen by eye doctor and 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 All questions answered   Total Time Spent  20 mins   Maretta Bees Patricia Pesa, M.D.  [facility]  Medical Director [place] Director [facility] Cardio-Pulmonary Department

## 2021-07-27 NOTE — Addendum Note (Signed)
Addended by: Claudette Head A on: 07/27/2021 11:54 AM   Modules accepted: Orders

## 2021-10-06 ENCOUNTER — Other Ambulatory Visit: Payer: Self-pay

## 2021-10-06 MED ORDER — PROLIA 60 MG/ML ~~LOC~~ SOSY: PREFILLED_SYRINGE | SUBCUTANEOUS | 0 refills | Status: AC

## 2022-06-24 NOTE — Progress Notes (Unsigned)
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 Medications reviewed by the patient verbally.     HPI:  Katrina Donovan is a 76 year old woman with history of  hypertension Shortness of breath  Who presents by self-referral for cardiac risk factor discussion, recent diagnosis of central vein occlusion  She presents today with her daughter, Dr. 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, concern for hypertension Losartan dosing was increased up to 50 twice daily She reports blood pressure well controlled at home typically 382 up to 505 systolic Luckily no significant visual deficits reported [place]  In general feeling well, some mild shortness of breath when climbing stairs still active at baseline Able to walk on flat surface without any difficulty  Previously with episodes of chest pain Stress test 12/20, no significant ischemia  Lab work reviewed Total cholesterol 171 triglycerides 79 HDL 62 LDL 94 A1c 5.7, normal renal function creatinine 0.6  EKG personally reviewed by myself on todays visit Sinus bradycardia rate 58 bpm no significant ST-T wave changes, PAC noted  Other past medical history reviewed Myoview April 17, 2017 no ischemia Performed in [place]   PMH:   has a past medical history of Hypertension, Hypothyroidism, Spinal stenosis, and Thyroid disease.  PSH:    Past Surgical History:  Procedure Laterality Date   COLONOSCOPY WITH PROPOFOL N/A 07/26/2020   Procedure: COLONOSCOPY WITH PROPOFOL;  Surgeon: [name], MD;  Location: [facility] ENDOSCOPY;  Service: Gastroenterology;  Laterality: N/A;   ESOPHAGOGASTRODUODENOSCOPY (EGD) WITH PROPOFOL N/A  07/26/2020   Procedure: ESOPHAGOGASTRODUODENOSCOPY (EGD) WITH PROPOFOL;  Surgeon: [name], MD;  Location: [facility] ENDOSCOPY;  Service: Gastroenterology;  Laterality: N/A;    Current Outpatient Medications  Medication Sig Dispense Refill   aspirin EC 81 MG tablet Take 81 mg by mouth daily.     denosumab (PROLIA) 60 MG/ML SOSY injection Inject '60mg'$  once every 6 months; Diagnosis Osteoporosis (M81.0) 1 mL 0   levothyroxine (SYNTHROID) 50 MCG tablet Take 50 mcg by mouth daily.     losartan (COZAAR) 50 MG tablet Take 50 mg by mouth 2 (two) times daily.     Multiple Vitamin (MULTI-VITAMIN) tablet Take 1 tablet by mouth daily.     No current facility-administered medications for this visit.    Allergies:   Patient has no allergy information on record.   Social History:  The patient  reports that she has never smoked. She has never used smokeless tobacco. She reports that she does not drink alcohol and does not use drugs.   Family History:   family history includes Cancer in her mother.    Review of Systems: Review of Systems  Constitutional: Negative.   HENT: Negative.    Respiratory:  Positive for shortness of breath.   Cardiovascular: Negative.   Gastrointestinal: Negative.   Musculoskeletal: Negative.   Neurological: Negative.   Psychiatric/Behavioral: Negative.    All other systems reviewed and are negative.   PHYSICAL EXAM: VS:  BP 130/64 (BP Location: Left Arm, Patient Position: Sitting, Cuff Size: Normal)   Pulse (!) 58   Ht 5' 2.5" (1.588 m)   Wt 122 lb 4 oz (55.5 kg)   SpO2 99%  BMI 22.00 kg/m  , BMI Body mass index is 22 kg/m. Constitutional:  oriented to person, place, and time. No distress.  HENT:  Head: Grossly normal Eyes:  no discharge. No scleral icterus.  Neck: No JVD, no carotid bruits  Cardiovascular: Regular rate and rhythm, no murmurs appreciated Pulmonary/Chest: Clear to auscultation bilaterally, no wheezes or rails Abdominal: Soft.  no distension.   no tenderness.  Musculoskeletal: Normal range of motion Neurological:  normal muscle tone. Coordination normal. No atrophy Skin: Skin warm and dry Psychiatric: normal affect, pleasant   Recent Labs: No results found for requested labs within last 365 days.    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, unspecified complication status       Relevant Medications   losartan (COZAAR) 50 MG tablet     Central vein occlusion of retina Felt to be nonischemic Low risk factor profile : non-smoker, no diabetes, cholesterol reasonable  Hyperlipidemia Discussed screening options, we have ordered CT coronary calcium scoring for further risk stratification  Hypertension Losartan up to 50 twice daily in the past several days Blood pressure running well at home no changes made   Total encounter time more than 30 minutes  Greater than 50% was spent in counseling and coordination of care with the patient    Signed, Esmond Plants, M.D., Ph.D. [place], [place]

## 2022-06-25 ENCOUNTER — Ambulatory Visit: Payer: Medicare Other | Attending: Cardiovascular Disease | Admitting: Cardiovascular Disease

## 2022-06-25 ENCOUNTER — Encounter: Payer: Self-pay | Admitting: Cardiovascular Disease

## 2022-06-25 VITALS — BP 130/64 | HR 58 | Ht 62.5 in | Wt 122.2 lb

## 2022-06-25 DIAGNOSIS — H348112 Central retinal vein occlusion, right eye, stable: Secondary | ICD-10-CM

## 2022-06-25 DIAGNOSIS — E782 Mixed hyperlipidemia: Secondary | ICD-10-CM

## 2022-06-25 DIAGNOSIS — I10 Essential (primary) hypertension: Secondary | ICD-10-CM | POA: Diagnosis not present

## 2022-06-25 NOTE — Patient Instructions (Addendum)
Medication Instructions:  No changes  If you need a refill on your cardiac medications before your next appointment, please call your pharmacy.    Lab work: No new labs needed   Testing/Procedures:  CT Coronary Calcium Score:  Your physician has recommended that you have CT Coronary Calcium Score.  - $99 out of pocket cost at the time of your test - Call [phone] to schedule at your convenience.  Location: [place], [place] [address]    Follow-Up: At [facility], you and your health needs are our priority.  As part of our continuing mission to provide you with exceptional heart care, we have created designated Provider Care Teams.  These Care Teams include your primary Cardiologist (physician) and Advanced Practice Providers (APPs -  Physician Assistants and Nurse Practitioners) who all work together to provide you with the care you need, when you need it.  You will need a follow up appointment as needed  Providers on your designated Care Team:   Murray Hodgkins, NP Christell Faith, PA-C Cadence Kathlen Mody, [place]  COVID-19 Vaccine Information can be found at: [email or web address].uk For questions related to vaccine distribution or appointments, please email [email or web address] or call [phone].

## 2022-06-26 ENCOUNTER — Telehealth: Payer: Self-pay | Admitting: Cardiovascular Disease

## 2022-06-26 ENCOUNTER — Ambulatory Visit
Admission: RE | Admit: 2022-06-26 | Discharge: 2022-06-26 | Disposition: A | Discharge status: Home / Self Care | Payer: Medicare Other | Source: Ambulatory Visit | Attending: Cardiovascular Disease | Admitting: Cardiovascular Disease

## 2022-06-26 DIAGNOSIS — E782 Mixed hyperlipidemia: Secondary | ICD-10-CM | POA: Insufficient documentation

## 2022-06-26 NOTE — Telephone Encounter (Signed)
Spoke with daughter Jordan Caraveo who wanted results of CT scoring. Informed daughter that the exam has not been resulted by Dr. Rockey Situ yet. She wanted to know the Calcium score, which I gave her of 23.5.

## 2022-06-26 NOTE — Telephone Encounter (Signed)
Pt's daughter is calling in regards to CT results. States they would have results before noon, because pt lives in [place], [place] and they need to take her back soon, but didn't want to leave until they knew the results. Please advise.

## 2022-11-02 ENCOUNTER — Other Ambulatory Visit: Payer: Self-pay

## 2022-11-02 MED ORDER — PROLIA 60 MG/ML ~~LOC~~ SOSY: PREFILLED_SYRINGE | SUBCUTANEOUS | 0 refills | Status: AC

## 2023-02-07 ENCOUNTER — Other Ambulatory Visit: Payer: Self-pay | Admitting: Cardiovascular Disease

## 2023-02-07 MED ORDER — ROSUVASTATIN CALCIUM 5 MG PO TABS: 5.0000 mg | ORAL_TABLET | Freq: Every day | ORAL | 3 refills | Status: DC

## 2023-05-08 ENCOUNTER — Other Ambulatory Visit: Payer: Self-pay

## 2023-05-08 ENCOUNTER — Other Ambulatory Visit (HOSPITAL_COMMUNITY): Payer: Self-pay

## 2023-05-08 MED ORDER — PROLIA 60 MG/ML ~~LOC~~ SOSY: 60.0000 mg | PREFILLED_SYRINGE | SUBCUTANEOUS | 0 refills | Status: AC

## 2023-05-09 ENCOUNTER — Other Ambulatory Visit: Payer: Self-pay

## 2023-05-10 ENCOUNTER — Other Ambulatory Visit (HOSPITAL_COMMUNITY): Payer: Self-pay

## 2023-05-24 ENCOUNTER — Telehealth: Payer: Self-pay | Admitting: Cardiovascular Disease

## 2023-05-24 MED ORDER — ROSUVASTATIN CALCIUM 5 MG PO TABS: 5.0000 mg | ORAL_TABLET | Freq: Every day | ORAL | 0 refills | Status: DC

## 2023-05-24 NOTE — Telephone Encounter (Signed)
Requested Prescriptions   Signed Prescriptions Disp Refills  . rosuvastatin (CRESTOR) 5 MG tablet 90 tablet 0    Sig: Take 1 tablet (5 mg total) by mouth daily.    Authorizing Provider: GOLLAN, TIMOTHY J    Ordering User: LOPEZ, MARINA C    

## 2023-05-24 NOTE — Telephone Encounter (Signed)
*  STAT* If patient is at the pharmacy, call can be transferred to refill team.   1. Which medications need to be refilled? (please list name of each medication and dose if known) rosuvastatin (CRESTOR) 5 MG tablet    2. Would you like to learn more about the convenience, safety, & potential cost savings by using the [facility]? No   3. Are you open to using the [facility] (Type [facility] )No   4. Which pharmacy/location (including street and city if local pharmacy) is medication to be sent to?[facility] - [place], [place] - [address]    5. Do they need a 30 day or 90 day supply? 90 day

## 2023-06-11 ENCOUNTER — Other Ambulatory Visit: Payer: Self-pay | Admitting: Cardiovascular Disease

## 2023-06-29 ENCOUNTER — Encounter (HOSPITAL_COMMUNITY): Payer: Self-pay

## 2023-09-23 ENCOUNTER — Other Ambulatory Visit: Payer: Self-pay

## 2023-09-24 ENCOUNTER — Other Ambulatory Visit: Payer: Self-pay

## 2023-09-26 ENCOUNTER — Other Ambulatory Visit: Payer: Self-pay

## 2023-10-14 ENCOUNTER — Other Ambulatory Visit: Payer: Self-pay

## 2023-10-14 NOTE — Progress Notes (Signed)
 Unable to reach. Dis-enrolling

## 2024-03-19 ENCOUNTER — Other Ambulatory Visit: Payer: Self-pay | Admitting: Cardiovascular Disease

## 2024-04-07 ENCOUNTER — Other Ambulatory Visit: Payer: Self-pay | Admitting: Cardiovascular Disease

## 2024-06-17 ENCOUNTER — Other Ambulatory Visit: Payer: Self-pay | Admitting: Cardiovascular Disease

## 2024-06-22 ENCOUNTER — Other Ambulatory Visit: Payer: Self-pay | Admitting: Cardiovascular Disease
